# Patient Record
Sex: Female | Born: 2000 | Race: White | Hispanic: No | Marital: Single | Smoking: Never smoker
Health system: Southern US, Community
[De-identification: ages and names within clinical notes are randomized; demographics above are authoritative.]

## PROBLEM LIST (undated history)

## (undated) HISTORY — PX: NO PAST SURGERIES: SHX2092

---
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
## 2020-06-09 ENCOUNTER — Ambulatory Visit: Payer: BC Managed Care – PPO | Admitting: Obstetrics and Gynecology
